# Patient Record
Sex: Male | Born: 2000 | Race: Black or African American | Hispanic: No | Marital: Single | Smoking: Never smoker
Health system: Southern US, Community
[De-identification: ages and names within clinical notes are randomized; demographics above are authoritative.]

## PROBLEM LIST (undated history)

## (undated) DIAGNOSIS — E119 Type 2 diabetes mellitus without complications: Secondary | ICD-10-CM

## (undated) DIAGNOSIS — J45909 Unspecified asthma, uncomplicated: Secondary | ICD-10-CM

## (undated) HISTORY — PX: TYMPANOSTOMY TUBE PLACEMENT: SHX32

## (undated) HISTORY — PX: ADENOIDECTOMY: SUR15

## (undated) HISTORY — PX: TONSILLECTOMY: SUR1361

---
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
## 2020-10-27 IMAGING — DX DG CHEST 1V PORT
1 series · 1 of 1 positions shown · non-contrast
Comparison: 07/15/2016

CLINICAL DATA: Fever, shortness of breath, diarrhea and headache
for the past 2 days.

EXAM:
PORTABLE CHEST 1 VIEW

[chest ap]
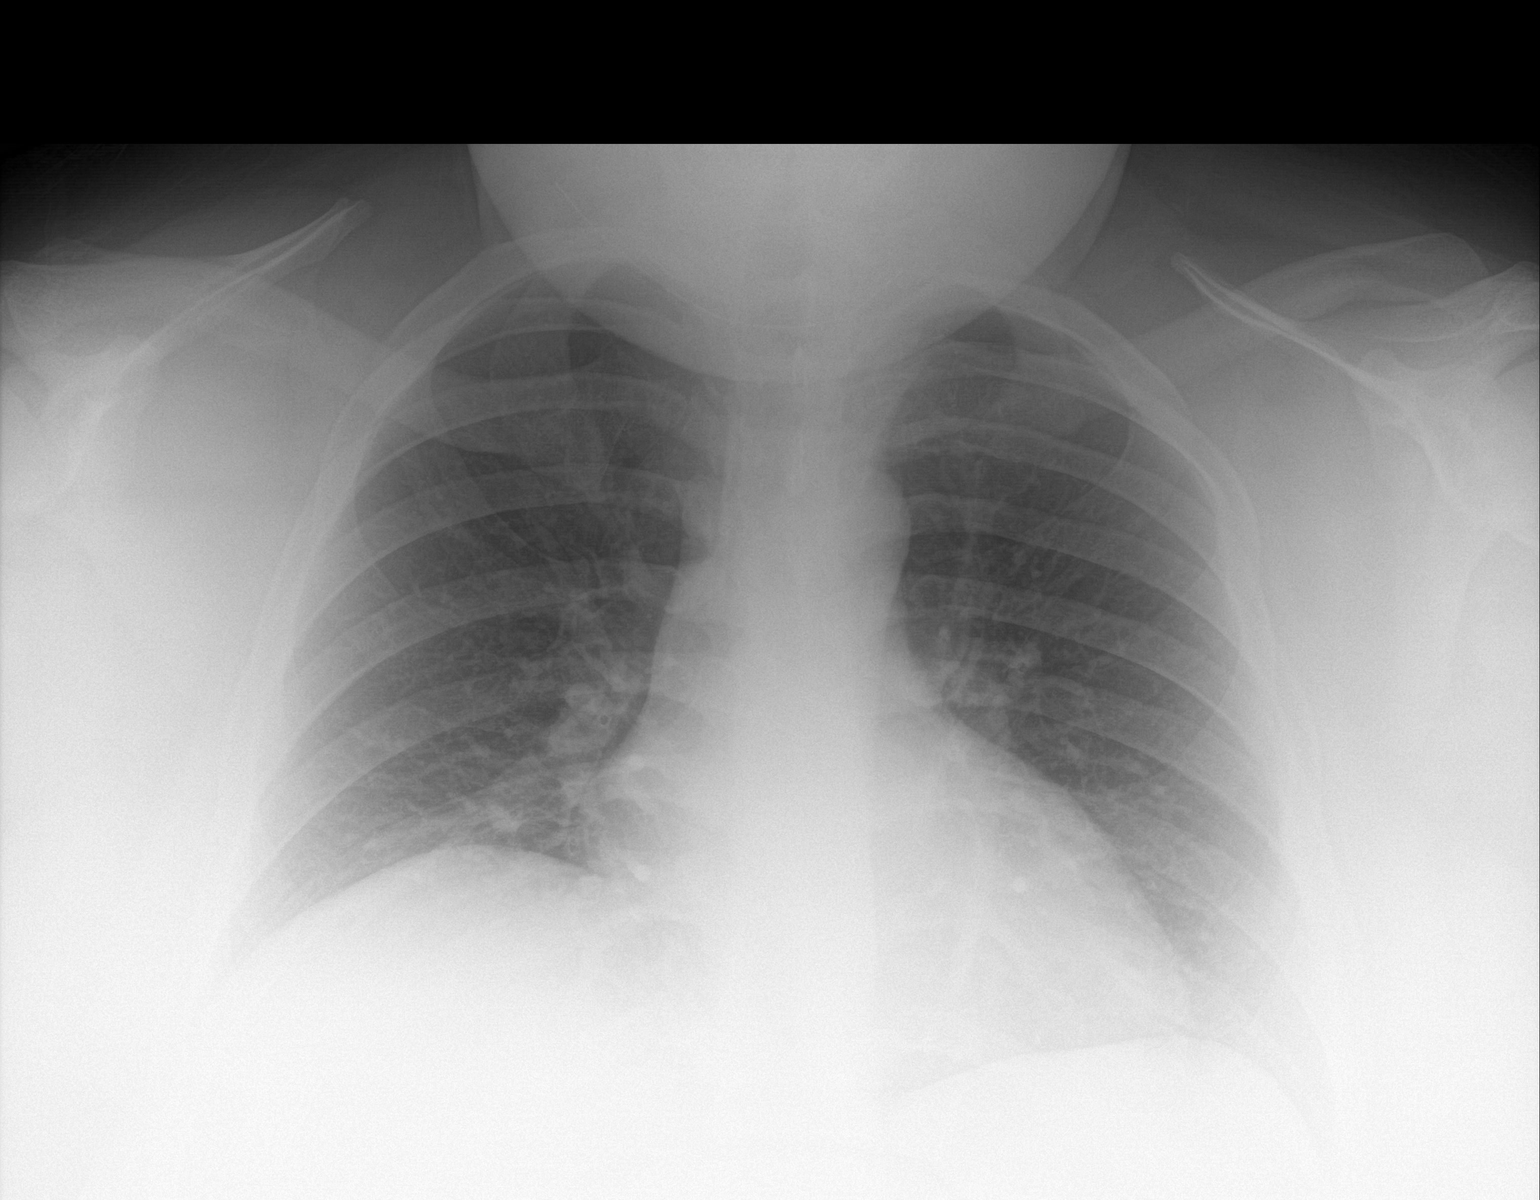

[1 of 1 positions shown; findings below may reference images not displayed]

FINDINGS: Examination is degraded due to patient body habitus and portable
technique.

Grossly unchanged borderline enlarged cardiac silhouette and
mediastinal contours given reduced lung volumes. No discrete focal
airspace opacities. No pleural effusion or pneumothorax. No evidence
of edema. No acute osseous abnormalities.
IMPRESSION: Followup PA and lateral chest X-ray is recommended in 3-4 weeks
following trial of antibiotic therapy to ensure resolution and
exclude underlying malignancy.

## 2022-06-25 ENCOUNTER — Emergency Department (HOSPITAL_BASED_OUTPATIENT_CLINIC_OR_DEPARTMENT_OTHER)
Admission: EM | Admit: 2022-06-25 | Discharge: 2022-06-25 | Disposition: A | Discharge status: Home / Self Care | Payer: Medicaid Other | Attending: Emergency Medicine | Admitting: Emergency Medicine

## 2022-06-25 ENCOUNTER — Encounter (HOSPITAL_BASED_OUTPATIENT_CLINIC_OR_DEPARTMENT_OTHER): Payer: Self-pay | Admitting: *Deleted

## 2022-06-25 ENCOUNTER — Other Ambulatory Visit: Payer: Self-pay

## 2022-06-25 DIAGNOSIS — Z7984 Long term (current) use of oral hypoglycemic drugs: Secondary | ICD-10-CM | POA: Insufficient documentation

## 2022-06-25 DIAGNOSIS — Z20822 Contact with and (suspected) exposure to covid-19: Secondary | ICD-10-CM | POA: Diagnosis not present

## 2022-06-25 DIAGNOSIS — R739 Hyperglycemia, unspecified: Secondary | ICD-10-CM

## 2022-06-25 DIAGNOSIS — R197 Diarrhea, unspecified: Secondary | ICD-10-CM | POA: Diagnosis not present

## 2022-06-25 DIAGNOSIS — E1165 Type 2 diabetes mellitus with hyperglycemia: Secondary | ICD-10-CM | POA: Insufficient documentation

## 2022-06-25 DIAGNOSIS — R112 Nausea with vomiting, unspecified: Secondary | ICD-10-CM | POA: Diagnosis present

## 2022-06-25 DIAGNOSIS — R42 Dizziness and giddiness: Secondary | ICD-10-CM | POA: Diagnosis not present

## 2022-06-25 DIAGNOSIS — E860 Dehydration: Secondary | ICD-10-CM | POA: Diagnosis not present

## 2022-06-25 DIAGNOSIS — E876 Hypokalemia: Secondary | ICD-10-CM | POA: Diagnosis not present

## 2022-06-25 HISTORY — DX: Type 2 diabetes mellitus without complications: E11.9

## 2022-06-25 LAB — COMPREHENSIVE METABOLIC PANEL
ALT: 38 U/L (ref 0–44)
AST: 28 U/L (ref 15–41)
Albumin: 3.6 g/dL (ref 3.5–5.0)
Alkaline Phosphatase: 59 U/L (ref 38–126)
Anion gap: 11 (ref 5–15)
BUN: 9 mg/dL (ref 6–20)
CO2: 21 mmol/L — ABNORMAL LOW (ref 22–32)
Calcium: 8.8 mg/dL — ABNORMAL LOW (ref 8.9–10.3)
Chloride: 99 mmol/L (ref 98–111)
Creatinine, Ser: 0.8 mg/dL (ref 0.61–1.24)
GFR, Estimated: >60 mL/min (ref >60)
Glucose, Bld: 225 mg/dL — ABNORMAL HIGH (ref 70–99)
Potassium: 2.9 mmol/L — ABNORMAL LOW (ref 3.5–5.1)
Sodium: 131 mmol/L — ABNORMAL LOW (ref 135–145)
Total Bilirubin: 0.9 mg/dL (ref 0.3–1.2)
Total Protein: 7.8 g/dL (ref 6.5–8.1)

## 2022-06-25 LAB — URINALYSIS, MICROSCOPIC (REFLEX)
RBC / HPF: NONE SEEN RBC/hpf (ref 0–5)
WBC, UA: NONE SEEN WBC/hpf (ref 0–5)

## 2022-06-25 LAB — CBC
HCT: 44.9 % (ref 39.0–52.0)
Hemoglobin: 15.1 g/dL (ref 13.0–17.0)
MCH: 27.9 pg (ref 26.0–34.0)
MCHC: 33.6 g/dL (ref 30.0–36.0)
MCV: 82.8 fL (ref 80.0–100.0)
Platelets: 353 10*3/uL (ref 150–400)
RBC: 5.42 MIL/uL (ref 4.22–5.81)
RDW: 11.4 % — ABNORMAL LOW (ref 11.5–15.5)
WBC: 7.5 10*3/uL (ref 4.0–10.5)
nRBC: 0 % (ref 0.0–0.2)

## 2022-06-25 LAB — LIPASE, BLOOD: Lipase: 20 U/L (ref 11–51)

## 2022-06-25 LAB — URINALYSIS, ROUTINE W REFLEX MICROSCOPIC
Glucose, UA: >=500 mg/dL — AB
Hgb urine dipstick: NEGATIVE
Ketones, ur: NEGATIVE mg/dL
Leukocytes,Ua: NEGATIVE
Nitrite: NEGATIVE
Protein, ur: 100 mg/dL — AB
Specific Gravity, Urine: 1.025 (ref 1.005–1.030)
pH: 5 (ref 5.0–8.0)

## 2022-06-25 LAB — RESP PANEL BY RT-PCR (RSV, FLU A&B, COVID)  RVPGX2
Influenza A by PCR: NEGATIVE
Influenza B by PCR: NEGATIVE
Resp Syncytial Virus by PCR: NEGATIVE
SARS Coronavirus 2 by RT PCR: NEGATIVE

## 2022-06-25 LAB — CBG MONITORING, ED: Glucose-Capillary: 232 mg/dL — ABNORMAL HIGH (ref 70–99)

## 2022-06-25 MED ORDER — ONDANSETRON 4 MG PO TBDP
4.0000 mg | ORAL_TABLET | Freq: Once | ORAL | Status: AC | PRN
  Administered 2022-06-25: 4 mg via ORAL
  Filled 2022-06-25: qty 1

## 2022-06-25 MED ORDER — FAMOTIDINE IN NACL 20-0.9 MG/50ML-% IV SOLN
20.0000 mg | Freq: Once | INTRAVENOUS | Status: AC
  Administered 2022-06-25: 20 mg via INTRAVENOUS
  Filled 2022-06-25: qty 50

## 2022-06-25 MED ORDER — SODIUM CHLORIDE 0.9 % IV BOLUS
1000.0000 mL | Freq: Once | INTRAVENOUS | Status: AC
  Administered 2022-06-25: 1000 mL via INTRAVENOUS

## 2022-06-25 MED ORDER — POTASSIUM CHLORIDE CRYS ER 20 MEQ PO TBCR
40.0000 meq | EXTENDED_RELEASE_TABLET | Freq: Once | ORAL | Status: AC
  Administered 2022-06-25: 40 meq via ORAL
  Filled 2022-06-25: qty 2

## 2022-06-25 MED ORDER — ONDANSETRON 4 MG PO TBDP: 4.0000 mg | ORAL_TABLET | Freq: Three times a day (TID) | ORAL | 0 refills | Status: AC | PRN

## 2022-06-25 NOTE — ED Notes (Signed)
Pt laying supine in bed, appears comfortable. Warm blankets provided. Call bell in reach. Pt verbalized no additional need at this time.

## 2022-06-25 NOTE — ED Provider Notes (Signed)
[facility] Provider Note   CSN: [id number] Arrival date & time: 06/25/22  1859     History {Add pertinent medical, surgical, social history, OB history to HPI:1} Chief Complaint  Patient presents with  . Dehydration    Jacob Mcfarland is a 22 y.o. male.  He has a history of diabetes type 2.  He has had nausea vomiting diarrhea and some crampy abdominal pain since yesterday.  Symptoms are improving today but he feels just generally weak and lightheaded.  No blood in the vomitus or diarrhea.  No fevers.  Family recommended he come to the emergency department as he is a diabetic.  He works driving a Human resources officer and so he thinks one of the kids might of gotten him sick.  The history is provided by the patient.  Emesis Severity:  Moderate Duration:  24 hours Timing:  Intermittent Quality:  Stomach contents Progression:  Improving Chronicity:  New Recent urination:  Normal Relieved by:  None tried Worsened by:  Liquids Ineffective treatments:  None tried Associated symptoms: abdominal pain and diarrhea   Associated symptoms: no fever        Home Medications Prior to Admission medications   Medication Sig Start Date End Date Taking? Authorizing Provider  albuterol (PROVENTIL HFA;VENTOLIN HFA) 108 (90 BASE) MCG/ACT inhaler Inhale 2 puffs into the lungs every 6 (six) hours as needed.    [provider]  beclomethasone (QVAR) 80 MCG/ACT inhaler Inhale 1 puff into the lungs as needed.    [provider]  cetirizine (ZYRTEC) 10 MG tablet Take 10 mg by mouth daily.    [provider]  dicyclomine (BENTYL) 20 MG tablet Take 1 tablet (20 mg total) by mouth every 8 (eight) hours as needed for spasms. 05/21/19   Petrucelli, Samantha R, PA-C  EPINEPHrine (EPIPEN JR) 0.15 MG/0.3ML injection Inject into the muscle.    [provider]  fluticasone (FLONASE) 50 MCG/ACT nasal spray Place 1 spray into the nose daily.     [provider]  metFORMIN (GLUCOPHAGE-XR) 500 MG 24 hr tablet Take 1 tablet (500 mg total) by mouth daily with breakfast. 05/21/19   Petrucelli, Samantha R, PA-C  ondansetron (ZOFRAN ODT) 4 MG disintegrating tablet Take 1 tablet (4 mg total) by mouth every 8 (eight) hours as needed for nausea or vomiting. 05/21/19   Petrucelli, Glynda Jaeger, PA-C      Allergies    Bioflavonoids    Review of Systems   Review of Systems  Constitutional:  Negative for fever.  Gastrointestinal:  Positive for abdominal pain, diarrhea and vomiting.    Physical Exam Updated Vital Signs BP 117/72   Pulse 85   Temp 98.9 F (37.2 C) (Oral)   Resp 20   Ht 5\' 8"  (1.727 m)   Wt 104.3 kg   SpO2 99%   BMI 34.97 kg/m  Physical Exam  ED Results / Procedures / Treatments   Labs (all labs ordered are listed, but only abnormal results are displayed) Labs Reviewed  CBC - Abnormal; Notable for the following components:      Result Value   RDW 11.4 (*)    All other components within normal limits  COMPREHENSIVE METABOLIC PANEL - Abnormal; Notable for the following components:   Sodium 131 (*)    Potassium 2.9 (*)    CO2 21 (*)    Glucose, Bld 225 (*)    Calcium 8.8 (*)    All other components within normal  limits  CBG MONITORING, ED - Abnormal; Notable for the following components:   Glucose-Capillary 232 (*)    All other components within normal limits  RESP PANEL BY RT-PCR (RSV, FLU A&B, COVID)  RVPGX2  LIPASE, BLOOD  URINALYSIS, ROUTINE W REFLEX MICROSCOPIC  CBG MONITORING, ED    EKG None  Radiology No results found.  Procedures Procedures  {Document cardiac monitor, telemetry assessment procedure when appropriate:1}  Medications Ordered in ED Medications  sodium chloride 0.9 % bolus 1,000 mL (has no administration in time range)  famotidine (PEPCID) IVPB 20 mg premix (has no administration in time range)  ondansetron (ZOFRAN-ODT) disintegrating tablet 4 mg (4 mg Oral Given 06/25/22  1943)    ED Course/ Medical Decision Making/ A&P Clinical Course as of 06/25/22 2058  Wed Jun 25, 2022  2041 Labs showing low sodium low potassium low bicarb although normal gap and ketones negative.  Getting IV fluids and Pepcid, when feeling better will orally replete with some potassium.  No evidence of DKA.  Normal white count normal hemoglobin. [MB]    Clinical Course User Index [MB] Hayden Rasmussen, MD   {   Click here for ABCD2, HEART and other calculatorsREFRESH Note before signing :1}                          Medical Decision Making Amount and/or Complexity of Data Reviewed Labs: ordered.  Risk Prescription drug management.   ***  {Document critical care time when appropriate:1} {Document review of labs and clinical decision tools ie heart score, Chads2Vasc2 etc:1}  {Document your independent review of radiology images, and any outside records:1} {Document your discussion with family members, caretakers, and with consultants:1} {Document social determinants of health affecting pt's care:1} {Document your decision making why or why not admission, treatments were needed:1} Final Clinical Impression(s) / ED Diagnoses Final diagnoses:  None    Rx / DC Orders ED Discharge Orders     None

## 2022-06-25 NOTE — Discharge Instructions (Signed)
You are seen in the emergency department for nausea and vomiting with elevated blood sugars.  Your lab work showed some dehydration and low potassium.  You are given fluids and potassium with improvement in your symptoms.  We are prescribing you some nausea medication to use at home as needed.  Please follow-up with your regular doctor and return to the emergency department if any worsening or concerning symptoms

## 2022-06-25 NOTE — ED Triage Notes (Signed)
Patient states last night he had onset of abd pain and n/v.  Patient states he had 10 episodes of n/v last night and this morning.  Patient with complaints of headache and feeling light headed.  Patient reports he is a diabetic.  His sugars have been in the 300's.  He is insulin dependent.  Last dose was at 1400.  Patient has been able to drink some fluids but he has had poor appetite.  Patient denies fever.  Patient reports he has had diarrhea as well

## 2022-06-25 NOTE — ED Notes (Signed)
Pt A&OX4 ambulatory at d/c with independent steady gait. Pt verbalized understanding of d/c instructions, prescription and follow up care. 

## 2023-01-16 ENCOUNTER — Other Ambulatory Visit: Payer: Self-pay

## 2023-01-16 ENCOUNTER — Emergency Department (HOSPITAL_BASED_OUTPATIENT_CLINIC_OR_DEPARTMENT_OTHER)
Admission: EM | Admit: 2023-01-16 | Discharge: 2023-01-16 | Disposition: A | Discharge status: Home / Self Care | Payer: Medicaid Other | Attending: Emergency Medicine | Admitting: Emergency Medicine

## 2023-01-16 ENCOUNTER — Encounter (HOSPITAL_BASED_OUTPATIENT_CLINIC_OR_DEPARTMENT_OTHER): Payer: Self-pay

## 2023-01-16 DIAGNOSIS — J029 Acute pharyngitis, unspecified: Secondary | ICD-10-CM | POA: Diagnosis present

## 2023-01-16 DIAGNOSIS — R5383 Other fatigue: Secondary | ICD-10-CM | POA: Insufficient documentation

## 2023-01-16 DIAGNOSIS — E119 Type 2 diabetes mellitus without complications: Secondary | ICD-10-CM | POA: Insufficient documentation

## 2023-01-16 DIAGNOSIS — Z7984 Long term (current) use of oral hypoglycemic drugs: Secondary | ICD-10-CM | POA: Insufficient documentation

## 2023-01-16 DIAGNOSIS — Z20822 Contact with and (suspected) exposure to covid-19: Secondary | ICD-10-CM | POA: Diagnosis not present

## 2023-01-16 LAB — SARS CORONAVIRUS 2 BY RT PCR: SARS Coronavirus 2 by RT PCR: NEGATIVE

## 2023-01-16 LAB — GROUP A STREP BY PCR: Group A Strep by PCR: NOT DETECTED

## 2023-01-16 NOTE — ED Triage Notes (Signed)
The patient is having sore throat, fatigue and shortness of breath since yesterday.

## 2023-01-16 NOTE — ED Provider Notes (Signed)
[place] [facility]MENT AT [facility] Provider Note   CSN: [id number] Arrival date & time: 01/16/23  1547     History  Chief Complaint  Patient presents with   Sore Throat   Shortness of Breath    Jacob Mcfarland is a 22 y.o. male.  Patient with history of diabetes presents to the emergency department for evaluation of sore throat and fatigue for 1 day.  No associated fevers, ear pain.  He does have some congestion.  No cough.  States that he went to work and felt bad and could not continue.  No vomiting or diarrhea.  States that he has not checked his blood sugar in about a week but denies increased thirst or urination.       Home Medications Prior to Admission medications   Medication Sig Start Date End Date Taking? Authorizing Provider  albuterol (PROVENTIL HFA;VENTOLIN HFA) 108 (90 BASE) MCG/ACT inhaler Inhale 2 puffs into the lungs every 6 (six) hours as needed.    [provider]  beclomethasone (QVAR) 80 MCG/ACT inhaler Inhale 1 puff into the lungs as needed.    [provider]  cetirizine (ZYRTEC) 10 MG tablet Take 10 mg by mouth daily.    [provider]  dicyclomine (BENTYL) 20 MG tablet Take 1 tablet (20 mg total) by mouth every 8 (eight) hours as needed for spasms. 05/21/19   Petrucelli, Samantha R, PA-C  EPINEPHrine (EPIPEN JR) 0.15 MG/0.3ML injection Inject into the muscle.    [provider]  fluticasone (FLONASE) 50 MCG/ACT nasal spray Place 1 spray into the nose daily.    [provider]  metFORMIN (GLUCOPHAGE-XR) 500 MG 24 hr tablet Take 1 tablet (500 mg total) by mouth daily with breakfast. 05/21/19   Petrucelli, Samantha R, PA-C  ondansetron (ZOFRAN ODT) 4 MG disintegrating tablet Take 1 tablet (4 mg total) by mouth every 8 (eight) hours as needed for nausea or vomiting. 06/25/22   Terrilee Files, MD      Allergies    Bioflavonoids    Review of Systems   Review of Systems  Physical  Exam Updated Vital Signs BP (!) 142/94 (BP Location: Left Arm)   Pulse 84   Temp 98 F (36.7 C) (Oral)   Resp 20   Ht 5\' 8"  (1.727 m)   Wt 104 kg   SpO2 97%   BMI 34.86 kg/m  Physical Exam Vitals and nursing note reviewed.  Constitutional:      Appearance: He is well-developed.  HENT:     Head: Normocephalic and atraumatic.     Jaw: No trismus.     Right Ear: Tympanic membrane, ear canal and external ear normal.     Left Ear: Tympanic membrane, ear canal and external ear normal.     Nose: Congestion present. No mucosal edema or rhinorrhea.     Comments: Edema noted, left greater than right.    Mouth/Throat:     Mouth: Mucous membranes are not dry.     Pharynx: Uvula midline. Posterior oropharyngeal erythema present. No oropharyngeal exudate or uvula swelling.     Tonsils: No tonsillar abscesses.  Eyes:     General:        Right eye: No discharge.        Left eye: No discharge.     Conjunctiva/sclera: Conjunctivae normal.  Neck:     Comments: Full range of motion of the neck without difficulty Cardiovascular:     Rate and Rhythm: Normal  rate and regular rhythm.     Heart sounds: Normal heart sounds.  Pulmonary:     Effort: Pulmonary effort is normal. No respiratory distress.     Breath sounds: Normal breath sounds. No wheezing or rales.     Comments: Lungs are clear bilaterally Abdominal:     Palpations: Abdomen is soft.     Tenderness: There is no abdominal tenderness.  Musculoskeletal:     Cervical back: Normal range of motion and neck supple.  Skin:    General: Skin is warm and dry.  Neurological:     Mental Status: He is alert.     ED Results / Procedures / Treatments   Labs (all labs ordered are listed, but only abnormal results are displayed) Labs Reviewed  GROUP A STREP BY PCR  SARS CORONAVIRUS 2 BY RT PCR    EKG None  Radiology No results found.  Procedures Procedures    Medications Ordered in ED Medications - No data to display  ED  Course/ Medical Decision Making/ A&P    Patient seen and examined. History obtained directly from patient. Work-up including labs, imaging, EKG ordered in triage, if performed, were reviewed.    Labs/EKG: Independently reviewed and interpreted.  This included: Negative strep and COVID  Imaging: None ordered  Medications/Fluids: None ordered  Most recent vital signs reviewed and are as follows: BP (!) 142/94 (BP Location: Left Arm)   Pulse 84   Temp 98 F (36.7 C) (Oral)   Resp 20   Ht 5\' 8"  (1.727 m)   Wt 104 kg   SpO2 97%   BMI 34.86 kg/m   Initial impression: Pharyngitis, no red flags  Home treatment plan: OTC meds  Return instructions discussed with patient: Trouble swallowing, new or worsening symptoms  Follow-up instructions discussed with patient: PCP follow-up in 5 days if not improving                                Medical Decision Making  In regards to the patient's sore throat today, the following dangerous and potentially life threatening etiologies were considered on the differential diagnosis: Lugwig's angina, uvulitis, epiglottis, peritonsillar abscess, retropharyngeal abscess, Lemierre's syndrome. Also considered were more common causes such as: streptococcal pharyngitis, gonococcal pharyngitis, non-bacterial pharyngitis (cold viruses, HSV/coxsackievirus, influenza, COVID-19, infectious mononucleosis, oropharyngeal candidiasis), and other non-infectious causes including seasonal allergies/post-nasal drip, GERD/esophagitis, trauma.   The patient's vital signs, pertinent lab work and imaging were reviewed and interpreted as discussed in the ED course. Hospitalization was considered for further testing, treatments, or serial exams/observation. However as patient is well-appearing, has a stable exam, and reassuring studies today, I do not feel that they warrant admission at this time. This plan was discussed with the patient who verbalizes agreement and comfort with  this plan and seems reliable and able to return to the Emergency Department with worsening or changing symptoms.          Final Clinical Impression(s) / ED Diagnoses Final diagnoses:  Sore throat    Rx / DC Orders ED Discharge Orders     None         Renne Crigler, PA-C 01/16/23 1756    Vanetta Mulders, MD 01/16/23 [phone]

## 2023-01-16 NOTE — Discharge Instructions (Signed)
Please read and follow all provided instructions.  Your diagnoses today include:  1. Sore throat     Tests performed today include: Strep test: was negative for strep COVID test was negative for COVID Vital signs. See below for your results today.   Medications prescribed:  Please use over-the-counter NSAID medications (ibuprofen, naproxen) or Tylenol (acetaminophen) as directed on the packaging for pain -- as long as you do not have any reasons avoid these medications. Reasons to avoid NSAID medications include: weak kidneys, a history of bleeding in your stomach or gut, or uncontrolled high blood pressure or previous heart attack. Reasons to avoid Tylenol include: liver problems or ongoing alcohol use. Never take more than 4000mg  or 8 Extra strength Tylenol in a 24 hour period.     Take any medications prescribed only as directed.   Home care instructions:  Please read the educational materials provided and follow any instructions contained in this packet.  Follow-up instructions: Please follow-up with your primary care provider as needed for further evaluation of your symptoms.  Return instructions:  Please return to the Emergency Department if you experience worsening symptoms.  Return if you have worsening problems swallowing, your neck becomes swollen, you cannot swallow your saliva or your voice becomes muffled.  Return with high persistent fever, persistent vomiting, or if you have trouble breathing.  Please return if you have any other emergent concerns.  Additional Information:  Your vital signs today were: BP (!) 142/94 (BP Location: Left Arm)   Pulse 84   Temp 98 F (36.7 C) (Oral)   Resp 20   Ht 5\' 8"  (1.727 m)   Wt 104 kg   SpO2 97%   BMI 34.86 kg/m  If your blood pressure (BP) was elevated above 135/85 this visit, please have this repeated by your doctor within one month. --------------

## 2023-04-23 ENCOUNTER — Emergency Department (HOSPITAL_BASED_OUTPATIENT_CLINIC_OR_DEPARTMENT_OTHER): Payer: Medicaid Other

## 2023-04-23 ENCOUNTER — Other Ambulatory Visit: Payer: Self-pay

## 2023-04-23 ENCOUNTER — Other Ambulatory Visit (HOSPITAL_BASED_OUTPATIENT_CLINIC_OR_DEPARTMENT_OTHER): Payer: Self-pay

## 2023-04-23 ENCOUNTER — Emergency Department (HOSPITAL_BASED_OUTPATIENT_CLINIC_OR_DEPARTMENT_OTHER)
Admission: EM | Admit: 2023-04-23 | Discharge: 2023-04-23 | Disposition: A | Discharge status: Home / Self Care | Payer: Medicaid Other | Attending: Emergency Medicine | Admitting: Emergency Medicine

## 2023-04-23 DIAGNOSIS — R5383 Other fatigue: Secondary | ICD-10-CM | POA: Diagnosis present

## 2023-04-23 DIAGNOSIS — E1165 Type 2 diabetes mellitus with hyperglycemia: Secondary | ICD-10-CM | POA: Diagnosis not present

## 2023-04-23 DIAGNOSIS — E860 Dehydration: Secondary | ICD-10-CM | POA: Diagnosis not present

## 2023-04-23 DIAGNOSIS — Z20822 Contact with and (suspected) exposure to covid-19: Secondary | ICD-10-CM | POA: Diagnosis not present

## 2023-04-23 DIAGNOSIS — J45909 Unspecified asthma, uncomplicated: Secondary | ICD-10-CM | POA: Insufficient documentation

## 2023-04-23 DIAGNOSIS — Z7951 Long term (current) use of inhaled steroids: Secondary | ICD-10-CM | POA: Diagnosis not present

## 2023-04-23 DIAGNOSIS — Z7984 Long term (current) use of oral hypoglycemic drugs: Secondary | ICD-10-CM | POA: Insufficient documentation

## 2023-04-23 DIAGNOSIS — I10 Essential (primary) hypertension: Secondary | ICD-10-CM | POA: Insufficient documentation

## 2023-04-23 LAB — CBG MONITORING, ED
Glucose-Capillary: 402 mg/dL — ABNORMAL HIGH (ref 70–99)
Glucose-Capillary: 378 mg/dL — ABNORMAL HIGH (ref 70–99)
Glucose-Capillary: 327 mg/dL — ABNORMAL HIGH (ref 70–99)

## 2023-04-23 LAB — URINALYSIS, MICROSCOPIC (REFLEX): RBC / HPF: NONE SEEN RBC/hpf (ref 0–5)

## 2023-04-23 LAB — URINALYSIS, ROUTINE W REFLEX MICROSCOPIC
Bilirubin Urine: NEGATIVE
Glucose, UA: >=500 mg/dL — AB
Hgb urine dipstick: NEGATIVE
Ketones, ur: 80 mg/dL — AB
Leukocytes,Ua: NEGATIVE
Nitrite: NEGATIVE
Protein, ur: NEGATIVE mg/dL
Specific Gravity, Urine: 1.02 (ref 1.005–1.030)
pH: 5.5 (ref 5.0–8.0)

## 2023-04-23 LAB — BASIC METABOLIC PANEL
Anion gap: 15 (ref 5–15)
BUN: 12 mg/dL (ref 6–20)
CO2: 19 mmol/L — ABNORMAL LOW (ref 22–32)
Calcium: 9.1 mg/dL (ref 8.9–10.3)
Chloride: 96 mmol/L — ABNORMAL LOW (ref 98–111)
Creatinine, Ser: 0.79 mg/dL (ref 0.61–1.24)
GFR, Estimated: >60 mL/min (ref >60)
Glucose, Bld: 435 mg/dL — ABNORMAL HIGH (ref 70–99)
Potassium: 3.8 mmol/L (ref 3.5–5.1)
Sodium: 130 mmol/L — ABNORMAL LOW (ref 135–145)

## 2023-04-23 LAB — I-STAT VENOUS BLOOD GAS, ED
Acid-base deficit: 3 mmol/L — ABNORMAL HIGH (ref 0.0–2.0)
Bicarbonate: 21.7 mmol/L (ref 20.0–28.0)
Calcium, Ion: 1.16 mmol/L (ref 1.15–1.40)
HCT: 43 % (ref 39.0–52.0)
Hemoglobin: 14.6 g/dL (ref 13.0–17.0)
O2 Saturation: 66 %
Potassium: 4.2 mmol/L (ref 3.5–5.1)
Sodium: 133 mmol/L — ABNORMAL LOW (ref 135–145)
TCO2: 23 mmol/L (ref 22–32)
pCO2, Ven: 37.3 mm[Hg] — ABNORMAL LOW (ref 44–60)
pH, Ven: 7.372 (ref 7.25–7.43)
pO2, Ven: 35 mm[Hg] (ref 32–45)

## 2023-04-23 LAB — CBC WITH DIFFERENTIAL/PLATELET
Abs Immature Granulocytes: 0.04 10*3/uL (ref 0.00–0.07)
Basophils Absolute: 0 10*3/uL (ref 0.0–0.1)
Basophils Relative: 0 %
Eosinophils Absolute: 0.1 10*3/uL (ref 0.0–0.5)
Eosinophils Relative: 0 %
HCT: 44.3 % (ref 39.0–52.0)
Hemoglobin: 14.7 g/dL (ref 13.0–17.0)
Immature Granulocytes: 0 %
Lymphocytes Relative: 14 %
Lymphs Abs: 1.8 10*3/uL (ref 0.7–4.0)
MCH: 27.5 pg (ref 26.0–34.0)
MCHC: 33.2 g/dL (ref 30.0–36.0)
MCV: 82.8 fL (ref 80.0–100.0)
Monocytes Absolute: 1 10*3/uL (ref 0.1–1.0)
Monocytes Relative: 8 %
Neutro Abs: 9.4 10*3/uL — ABNORMAL HIGH (ref 1.7–7.7)
Neutrophils Relative %: 78 %
Platelets: 318 10*3/uL (ref 150–400)
RBC: 5.35 MIL/uL (ref 4.22–5.81)
RDW: 11.8 % (ref 11.5–15.5)
WBC: 12.3 10*3/uL — ABNORMAL HIGH (ref 4.0–10.5)
nRBC: 0 % (ref 0.0–0.2)

## 2023-04-23 LAB — RESP PANEL BY RT-PCR (RSV, FLU A&B, COVID)  RVPGX2
Influenza A by PCR: NEGATIVE
Influenza B by PCR: NEGATIVE
Resp Syncytial Virus by PCR: NEGATIVE
SARS Coronavirus 2 by RT PCR: NEGATIVE

## 2023-04-23 MED ORDER — SODIUM CHLORIDE 0.9 % IV BOLUS
1000.0000 mL | Freq: Once | INTRAVENOUS | Status: AC
  Administered 2023-04-23: 1000 mL via INTRAVENOUS

## 2023-04-23 MED ORDER — ULTICARE SHORT PEN NEEDLES 31G X 8 MM MISC
1.0000 | Freq: Four times a day (QID) | 0 refills | Status: AC
  Filled 2023-04-23: qty 100, 25d supply, fill #0

## 2023-04-23 MED ORDER — INSULIN ASPART 100 UNIT/ML IJ SOLN
10.0000 [IU] | Freq: Once | INTRAMUSCULAR | Status: AC
  Administered 2023-04-23: 10 [IU] via SUBCUTANEOUS

## 2023-04-23 MED ORDER — POTASSIUM CHLORIDE CRYS ER 20 MEQ PO TBCR
40.0000 meq | EXTENDED_RELEASE_TABLET | Freq: Once | ORAL | Status: AC
  Administered 2023-04-23: 40 meq via ORAL
  Filled 2023-04-23: qty 2

## 2023-04-23 MED ORDER — POTASSIUM CHLORIDE 10 MEQ/100ML IV SOLN
10.0000 meq | Freq: Once | INTRAVENOUS | Status: AC
  Administered 2023-04-23: 10 meq via INTRAVENOUS
  Filled 2023-04-23: qty 100

## 2023-04-23 NOTE — ED Notes (Signed)
X-ray at bedside

## 2023-04-23 NOTE — ED Triage Notes (Signed)
Pt here with "dehydration". Pt has been fatigues, feels like he is breathing differently, runny nose, congestions. This has been going on for 2 days. Denies fever. Denies sore throat or pain

## 2023-04-23 NOTE — Discharge Instructions (Signed)
You were seen in the emergency department for high blood sugar and dehydration The reason your blood sugar was so high was because you have not been taking her insulin as prescribed We gave you IV fluids and potassium and insulin here and your blood sugar started to come down Please go directly to the med center at [facility] pharmacy to pick up your new pen needles which we have called in You should be covered by insurance Follow-up with your primary care doctor in 1 week for reevaluation Return to the emergency department for uncontrolled blood sugars or any other concerns

## 2023-04-23 NOTE — ED Notes (Signed)
MD notified of 402 BG.

## 2023-04-23 NOTE — ED Provider Notes (Signed)
[place] [facility] AT [facility] Provider Note  CSN: [id number] Arrival date & time: 04/23/23 4098  Chief Complaint(s) Fatigue  HPI Jacob Mcfarland is a 22 y.o. male with past medical history as below, significant for IDDM, obesity, hypertension, asthma who presents to the ED with complaint of fatigue, dehydration, congestion  Symptoms ongoing the past 2 days, no fevers or chills, nausea or vomiting.  He does report increased thirst and dry mouth over the past few days.  Denies polyuria.  Reports he has run out of his insulin needles and has not received insulin 5 to 7 days, has not checked his blood sugar either.  Per chart review he is prescribed Ozempic 2mg , Humalog 15 units 3 times daily, Toujeo 160 units daily.  He does have history of prior admission for diabetic ketoacidosis few years ago.  Past Medical History Past Medical History:  Diagnosis Date   Asthma    Diabetes mellitus without complication (HCC)    Morbid obesity (HCC)    Patient Active Problem List   Diagnosis Date Noted   Asthma 12/24/2017   Snoring 04/20/2017   Gynecomastia, male 11/24/2016   Hypertension 07/16/2016   BMI (body mass index), pediatric, greater than 99% for age 53/01/2016   Obesity 10/07/2011   Seasonal allergies 10/07/2011   Home Medication(s) Prior to Admission medications   Medication Sig Start Date End Date Taking? Authorizing Provider  albuterol (PROVENTIL HFA;VENTOLIN HFA) 108 (90 BASE) MCG/ACT inhaler Inhale 2 puffs into the lungs every 6 (six) hours as needed.    [provider]  beclomethasone (QVAR) 80 MCG/ACT inhaler Inhale 1 puff into the lungs as needed.    [provider]  cetirizine (ZYRTEC) 10 MG tablet Take 10 mg by mouth daily.    [provider]  dicyclomine (BENTYL) 20 MG tablet Take 1 tablet (20 mg total) by mouth every 8 (eight) hours as needed for spasms. 05/21/19   Petrucelli, Samantha R, PA-C  EPINEPHrine (EPIPEN JR)  0.15 MG/0.3ML injection Inject into the muscle.    [provider]  fluticasone (FLONASE) 50 MCG/ACT nasal spray Place 1 spray into the nose daily.    [provider]  Insulin Pen Needle (ULTICARE SHORT PEN NEEDLES) 31G X 8 MM MISC Use as directed 4 (four) times daily. 04/23/23   Royanne Foots, DO  metFORMIN (GLUCOPHAGE-XR) 500 MG 24 hr tablet Take 1 tablet (500 mg total) by mouth daily with breakfast. 05/21/19   Petrucelli, Samantha R, PA-C  ondansetron (ZOFRAN ODT) 4 MG disintegrating tablet Take 1 tablet (4 mg total) by mouth every 8 (eight) hours as needed for nausea or vomiting. 06/25/22   Terrilee Files, MD                                                                                                                                    Past Surgical History Past Surgical History:  Procedure  Laterality Date   ADENOIDECTOMY     TONSILLECTOMY     TYMPANOSTOMY TUBE PLACEMENT     Family History Family History  Problem Relation Age of Onset   Hypertension Mother    Hypertension Father    Diabetes Father     Social History Social History   Tobacco Use   Smoking status: Never   Smokeless tobacco: Never  Substance Use Topics   Alcohol use: No   Drug use: No   Allergies Bioflavonoids  Review of Systems Review of Systems  Constitutional:  Positive for fatigue. Negative for chills and fever.  HENT:  Positive for congestion and postnasal drip.   Respiratory:  Negative for chest tightness and shortness of breath.   Cardiovascular:  Negative for chest pain.  Gastrointestinal:  Negative for abdominal pain, diarrhea and vomiting.  Endocrine: Positive for polydipsia.  Genitourinary:  Negative for dysuria and hematuria.  Musculoskeletal:  Negative for arthralgias.  All other systems reviewed and are negative.   Physical Exam Vital Signs  I have reviewed the triage vital signs BP (!) 147/88   Pulse 91   Temp 98.7 F (37.1 C)   Resp (!) 31   Ht 5\' 10"  (1.778  m)   Wt (!) 165.6 kg   SpO2 98%   BMI 52.37 kg/m  Physical Exam Vitals and nursing note reviewed.  Constitutional:      General: He is not in acute distress.    Appearance: He is well-developed. He is obese.  HENT:     Head: Normocephalic and atraumatic.     Right Ear: External ear normal.     Left Ear: External ear normal.     Mouth/Throat:     Mouth: Mucous membranes are dry.  Eyes:     General: No scleral icterus. Cardiovascular:     Rate and Rhythm: Regular rhythm. Tachycardia present.     Pulses: Normal pulses.     Heart sounds: Normal heart sounds.  Pulmonary:     Effort: Pulmonary effort is normal. No respiratory distress.     Breath sounds: Normal breath sounds.  Abdominal:     General: Abdomen is flat.     Palpations: Abdomen is soft.     Tenderness: There is no abdominal tenderness.  Musculoskeletal:     Cervical back: No rigidity.     Right lower leg: No edema.     Left lower leg: No edema.  Skin:    General: Skin is warm and dry.     Capillary Refill: Capillary refill takes less than 2 seconds.  Neurological:     Mental Status: He is alert.  Psychiatric:        Mood and Affect: Mood normal.        Behavior: Behavior normal.     ED Results and Treatments Labs (all labs ordered are listed, but only abnormal results are displayed) Labs Reviewed  CBC WITH DIFFERENTIAL/PLATELET - Abnormal; Notable for the following components:      Result Value   WBC 12.3 (*)    Neutro Abs 9.4 (*)    All other components within normal limits  BASIC METABOLIC PANEL - Abnormal; Notable for the following components:   Sodium 130 (*)    Chloride 96 (*)    CO2 19 (*)    Glucose, Bld 435 (*)    All other components within normal limits  URINALYSIS, ROUTINE W REFLEX MICROSCOPIC - Abnormal; Notable for the following components:   Glucose, UA >=500 (*)  Ketones, ur 80 (*)    All other components within normal limits  URINALYSIS, MICROSCOPIC (REFLEX) - Abnormal; Notable  for the following components:   Bacteria, UA RARE (*)    All other components within normal limits  CBG MONITORING, ED - Abnormal; Notable for the following components:   Glucose-Capillary 402 (*)    All other components within normal limits  I-STAT VENOUS BLOOD GAS, ED - Abnormal; Notable for the following components:   pCO2, Ven 37.3 (*)    Acid-base deficit 3.0 (*)    Sodium 133 (*)    All other components within normal limits  CBG MONITORING, ED - Abnormal; Notable for the following components:   Glucose-Capillary 378 (*)    All other components within normal limits  CBG MONITORING, ED - Abnormal; Notable for the following components:   Glucose-Capillary 327 (*)    All other components within normal limits  RESP PANEL BY RT-PCR (RSV, FLU A&B, COVID)  RVPGX2                                                                                                                          Radiology No results found.  Pertinent labs & imaging results that were available during my care of the patient were reviewed by me and considered in my medical decision making (see MDM for details).  Medications Ordered in ED Medications  sodium chloride 0.9 % bolus 1,000 mL (0 mLs Intravenous Stopped 04/23/23 0741)  sodium chloride 0.9 % bolus 1,000 mL (0 mLs Intravenous Stopped 04/23/23 0856)  potassium chloride SA (KLOR-CON M) CR tablet 40 mEq (40 mEq Oral Given 04/23/23 0700)  insulin aspart (novoLOG) injection 10 Units (10 Units Subcutaneous Given 04/23/23 0733)  potassium chloride 10 mEq in 100 mL IVPB (0 mEq Intravenous Stopped 04/23/23 0810)                                                                                                                                     Procedures Procedures  (including critical care time)  Medical Decision Making / ED Course    Medical Decision Making:    Jacob Mcfarland is a 22 y.o. male with past medical history as below, significant for IDDM, obesity,  hypertension, asthma who presents to the ED with complaint of fatigue, dehydration, congestion. The complaint involves an extensive differential diagnosis and also carries with it a high  risk of complications and morbidity.  Serious etiology was considered. Ddx includes but is not limited to: DKA, metabolic derangement, electrolyte derangement, hypovolemia, viral syndrome, pneumonia, etc.  Complete initial physical exam performed, notably the patient was in no acute distress, sitting upright on stretcher, tachycardia noted.  No hypoxia.    Reviewed and confirmed nursing documentation for past medical history, family history, social history.  Vital signs reviewed.     Clinical Course as of 04/24/23 0707  Thu Apr 23, 2023  2130 Per chart review he is rx'd Ozempic 2mg , Humalog 15 units 3 times daily, Toujeo 160 units daily  [SG]  0652 Ketones, ur(!): 80 [SG]  0652 CO2(!): 19 [SG]  0652 Anion gap: 15 [SG]  0652 Glucose-Capillary(!): 402 Give IV fluids, replete potassium.  Check pH.  He has not had his insulin in around 1 week.  [SG]  [id number] Sodium(!): 130 Pseudohyponatremia in setting of hyperglycemia [SG]       Clinical Course User Index  [SG] Sloan Leiter, DO    Brief summary: 23 yo male here with elev glucose, uri s/s. Labs stable as above. Given insulin and K replacement. DKA unlikely at this time, feeling better. Handoff to incoming EDP pending recheck and rpt CBG. He is having trouble paying for insulin supplies, would be reasonable to reach out to CM to see about low cost supplies, he saw his pcp around 2 wks ago. Labs stable, handoff to incoming EDP                  Additional history obtained: -Additional history obtained from  -External records from outside source obtained and reviewed including: Chart review including previous notes, labs, imaging, consultation notes including  Pcp documentation Prior ed visits Prior meds    Lab Tests: -I ordered, reviewed,  and interpreted labs.   The pertinent results include:   Labs Reviewed  CBC WITH DIFFERENTIAL/PLATELET - Abnormal; Notable for the following components:      Result Value   WBC 12.3 (*)    Neutro Abs 9.4 (*)    All other components within normal limits  BASIC METABOLIC PANEL - Abnormal; Notable for the following components:   Sodium 130 (*)    Chloride 96 (*)    CO2 19 (*)    Glucose, Bld 435 (*)    All other components within normal limits  URINALYSIS, ROUTINE W REFLEX MICROSCOPIC - Abnormal; Notable for the following components:   Glucose, UA >=500 (*)    Ketones, ur 80 (*)    All other components within normal limits  URINALYSIS, MICROSCOPIC (REFLEX) - Abnormal; Notable for the following components:   Bacteria, UA RARE (*)    All other components within normal limits  CBG MONITORING, ED - Abnormal; Notable for the following components:   Glucose-Capillary 402 (*)    All other components within normal limits  I-STAT VENOUS BLOOD GAS, ED - Abnormal; Notable for the following components:   pCO2, Ven 37.3 (*)    Acid-base deficit 3.0 (*)    Sodium 133 (*)    All other components within normal limits  CBG MONITORING, ED - Abnormal; Notable for the following components:   Glucose-Capillary 378 (*)    All other components within normal limits  CBG MONITORING, ED - Abnormal; Notable for the following components:   Glucose-Capillary 327 (*)    All other components within normal limits  RESP PANEL BY RT-PCR (RSV, FLU A&B, COVID)  [id number]    Notable for  as above, glucose elev  EKG   EKG Interpretation Date/Time:    Ventricular Rate:    PR Interval:    QRS Duration:    QT Interval:    QTC Calculation:   R Axis:      Text Interpretation:           Imaging Studies ordered: I ordered imaging studies including CXR I independently visualized the following imaging with scope of interpretation limited to determining acute life threatening conditions related to emergency care;  findings noted above I independently visualized and interpreted imaging. I agree with the radiologist interpretation   Medicines ordered and prescription drug management: Meds ordered this encounter  Medications   sodium chloride 0.9 % bolus 1,000 mL   sodium chloride 0.9 % bolus 1,000 mL   potassium chloride SA (KLOR-CON M) CR tablet 40 mEq   insulin aspart (novoLOG) injection 10 Units   potassium chloride 10 mEq in 100 mL IVPB    -I have reviewed the patients home medicines and have made adjustments as needed   Consultations Obtained: na   Cardiac Monitoring: Continuous pulse oximetry interpreted by myself, 99% on RA.    Social Determinants of Health:  Diagnosis or treatment significantly limited by social determinants of health: obesity   Reevaluation: After the interventions noted above, I reevaluated the patient and found that they have improved  Co morbidities that complicate the patient evaluation  Past Medical History:  Diagnosis Date   Asthma    Diabetes mellitus without complication (HCC)    Morbid obesity (HCC)       Dispostion: Disposition decision including need for hospitalization was considered, and patient disposition pending at time of sign out.    Final Clinical Impression(s) / ED Diagnoses Final diagnoses:  Uncontrolled type 2 diabetes mellitus with hyperglycemia (HCC)  Dehydration        Sloan Leiter, DO 04/24/23 2956

## 2023-04-23 NOTE — ED Provider Notes (Signed)
  Physical Exam  BP (!) 147/88   Pulse 91   Temp 98.7 F (37.1 C)   Resp (!) 31   Ht 5\' 10"  (1.778 m)   Wt (!) 165.6 kg   SpO2 98%   BMI 52.37 kg/m   Physical Exam Vitals and nursing note reviewed.  HENT:     Head: Normocephalic and atraumatic.  Eyes:     Pupils: Pupils are equal, round, and reactive to light.  Cardiovascular:     Rate and Rhythm: Normal rate and regular rhythm.  Pulmonary:     Effort: Pulmonary effort is normal.     Breath sounds: Normal breath sounds.  Abdominal:     Palpations: Abdomen is soft.     Tenderness: There is no abdominal tenderness.  Skin:    General: Skin is warm and dry.  Neurological:     Mental Status: He is alert.  Psychiatric:        Mood and Affect: Mood normal.     Procedures  Procedures  ED Course / MDM   Clinical Course as of 04/23/23 0943  Thu Apr 23, 2023  1610 Per chart review he is rx'd Ozempic 2mg , Humalog 15 units 3 times daily, Toujeo 160 units daily  [SG]  0652 Ketones, ur(!): 80 [SG]  0652 CO2(!): 19 [SG]  0652 Anion gap: 15 [SG]  0652 Glucose-Capillary(!): 402 Give IV fluids, replete potassium.  Check pH.  He has not had his insulin in around 1 week.  [SG]  [id number] Sodium(!): 130 Pseudohyponatremia in setting of hyperglycemia [SG]  0931 CBG still elevated but downtrending.  Patient reports feeling much better after IV fluids, potassium and insulin here.  He has all of his medications at home but the issue has been obtaining diabetic needles as last time he went to [facility] the needles were about $100 out-of-pocket for 100 needles.  He typically pays around $20 for 100 needles.  I have called our pharmacy here at [facility] and ordered a prescription for new pen needles which the patient will pick up and should be covered by his insurance.  We have them in stock.  He has all of his other medications at home.  Stable for discharge with close PCP follow-up [MP]    Clinical Course User Index [MP] Royanne Foots,  DO [SG] Sloan Leiter, DO   Medical Decision Making I, Estelle June DO, have assumed care of this patient from the previous provider pending reevaluation and repeat blood glucose check  Amount and/or Complexity of Data Reviewed Labs: ordered. Decision-making details documented in ED Course. Radiology: ordered.  Risk Prescription drug management.   Final diagnoses Uncontrolled diabetes mellitus with hyperglycemia Dehydration       Royanne Foots, DO 04/23/23 9604

## 2023-04-23 NOTE — ED Notes (Addendum)
Glucose was 378

## 2023-05-18 ENCOUNTER — Other Ambulatory Visit: Payer: Self-pay

## 2024-01-22 ENCOUNTER — Other Ambulatory Visit: Payer: Self-pay

## 2024-01-22 ENCOUNTER — Encounter (HOSPITAL_BASED_OUTPATIENT_CLINIC_OR_DEPARTMENT_OTHER): Payer: Self-pay

## 2024-01-22 ENCOUNTER — Emergency Department (HOSPITAL_BASED_OUTPATIENT_CLINIC_OR_DEPARTMENT_OTHER)
Admission: EM | Admit: 2024-01-22 | Discharge: 2024-01-22 | Disposition: A | Discharge status: Home / Self Care | Attending: Emergency Medicine | Admitting: Emergency Medicine

## 2024-01-22 DIAGNOSIS — Z794 Long term (current) use of insulin: Secondary | ICD-10-CM | POA: Diagnosis not present

## 2024-01-22 DIAGNOSIS — R42 Dizziness and giddiness: Secondary | ICD-10-CM | POA: Insufficient documentation

## 2024-01-22 DIAGNOSIS — Z7951 Long term (current) use of inhaled steroids: Secondary | ICD-10-CM | POA: Diagnosis not present

## 2024-01-22 DIAGNOSIS — R110 Nausea: Secondary | ICD-10-CM | POA: Insufficient documentation

## 2024-01-22 DIAGNOSIS — J45909 Unspecified asthma, uncomplicated: Secondary | ICD-10-CM | POA: Insufficient documentation

## 2024-01-22 DIAGNOSIS — E119 Type 2 diabetes mellitus without complications: Secondary | ICD-10-CM | POA: Diagnosis not present

## 2024-01-22 DIAGNOSIS — R519 Headache, unspecified: Secondary | ICD-10-CM | POA: Insufficient documentation

## 2024-01-22 DIAGNOSIS — Z7984 Long term (current) use of oral hypoglycemic drugs: Secondary | ICD-10-CM | POA: Diagnosis not present

## 2024-01-22 LAB — CBC WITH DIFFERENTIAL/PLATELET
Abs Immature Granulocytes: 0.02 K/uL (ref 0.00–0.07)
Basophils Absolute: 0 K/uL (ref 0.0–0.1)
Basophils Relative: 0 %
Eosinophils Absolute: 0.1 K/uL (ref 0.0–0.5)
Eosinophils Relative: 2 %
HCT: 44.9 % (ref 39.0–52.0)
Hemoglobin: 15.5 g/dL (ref 13.0–17.0)
Immature Granulocytes: 0 %
Lymphocytes Relative: 40 %
Lymphs Abs: 3.2 K/uL (ref 0.7–4.0)
MCH: 28.3 pg (ref 26.0–34.0)
MCHC: 34.5 g/dL (ref 30.0–36.0)
MCV: 82.1 fL (ref 80.0–100.0)
Monocytes Absolute: 0.5 K/uL (ref 0.1–1.0)
Monocytes Relative: 7 %
Neutro Abs: 4.1 K/uL (ref 1.7–7.7)
Neutrophils Relative %: 51 %
Platelets: 368 K/uL (ref 150–400)
RBC: 5.47 MIL/uL (ref 4.22–5.81)
RDW: 11.6 % (ref 11.5–15.5)
WBC: 7.9 K/uL (ref 4.0–10.5)
nRBC: 0 % (ref 0.0–0.2)

## 2024-01-22 LAB — COMPREHENSIVE METABOLIC PANEL WITH GFR
ALT: 62 U/L — ABNORMAL HIGH (ref 0–44)
AST: 45 U/L — ABNORMAL HIGH (ref 15–41)
Albumin: 4.4 g/dL (ref 3.5–5.0)
Alkaline Phosphatase: 77 U/L (ref 38–126)
Anion gap: 15 (ref 5–15)
BUN: 12 mg/dL (ref 6–20)
CO2: 22 mmol/L (ref 22–32)
Calcium: 9.7 mg/dL (ref 8.9–10.3)
Chloride: 100 mmol/L (ref 98–111)
Creatinine, Ser: 0.81 mg/dL (ref 0.61–1.24)
GFR, Estimated: >60 mL/min (ref >60)
Glucose, Bld: 287 mg/dL — ABNORMAL HIGH (ref 70–99)
Potassium: 3.5 mmol/L (ref 3.5–5.1)
Sodium: 137 mmol/L (ref 135–145)
Total Bilirubin: 0.5 mg/dL (ref 0.0–1.2)
Total Protein: 7.4 g/dL (ref 6.5–8.1)

## 2024-01-22 LAB — LIPASE, BLOOD: Lipase: 16 U/L (ref 11–51)

## 2024-01-22 MED ORDER — METOCLOPRAMIDE HCL 5 MG/ML IJ SOLN
10.0000 mg | Freq: Once | INTRAMUSCULAR | Status: AC
  Administered 2024-01-22: 10 mg via INTRAVENOUS
  Filled 2024-01-22: qty 2

## 2024-01-22 MED ORDER — SODIUM CHLORIDE 0.9 % IV BOLUS
1000.0000 mL | Freq: Once | INTRAVENOUS | Status: AC
  Administered 2024-01-22: 1000 mL via INTRAVENOUS

## 2024-01-22 MED ORDER — KETOROLAC TROMETHAMINE 30 MG/ML IJ SOLN
15.0000 mg | Freq: Once | INTRAMUSCULAR | Status: AC
  Administered 2024-01-22: 15 mg via INTRAVENOUS
  Filled 2024-01-22: qty 1

## 2024-01-22 MED ORDER — METOCLOPRAMIDE HCL 10 MG PO TABS: 10.0000 mg | ORAL_TABLET | Freq: Three times a day (TID) | ORAL | 0 refills | Status: AC | PRN

## 2024-01-22 NOTE — ED Provider Notes (Signed)
 Jacob Mcfarland Provider Note   CSN: [id number] Arrival date & time: 01/22/24  2015     Patient presents with: Dizziness   Jacob Mcfarland is a 23 y.o. male with a past medical history morbid obesity poorly controlled type 2 diabetes who presents emergency department with lightheadedness nausea and headache.  Patient reports that he recently had his Ozempic dose increased last week.  Since that time he has been having intermittent nausea without vomiting and an aching headache.  Patient reports that he took his Ozempic again at 1:00 in the morning today and has had worsening in those persistent symptoms for the last week including worsening nausea lightheadedness and a headache.  He denies light sensitivity or vomiting, fever or chills, cough or urinary symptoms.    Dizziness      Prior to Admission medications   Medication Sig Start Date End Date Taking? Authorizing Provider  albuterol  (PROVENTIL  HFA;VENTOLIN  HFA) 108 (90 BASE) MCG/ACT inhaler Inhale 2 puffs into the lungs every 6 (six) hours as needed.    [provider]  beclomethasone (QVAR) 80 MCG/ACT inhaler Inhale 1 puff into the lungs as needed.    [provider]  cetirizine (ZYRTEC) 10 MG tablet Take 10 mg by mouth daily.    [provider]  dicyclomine  (BENTYL ) 20 MG tablet Take 1 tablet (20 mg total) by mouth every 8 (eight) hours as needed for spasms. 05/21/19   Petrucelli, Samantha R, PA-C  EPINEPHrine (EPIPEN JR) 0.15 MG/0.3ML injection Inject into the muscle.    [provider]  fluticasone (FLONASE) 50 MCG/ACT nasal spray Place 1 spray into the nose daily.    [provider]  Insulin  Pen Needle (ULTICARE SHORT PEN NEEDLES) 31G X 8 MM MISC Use as directed 4 (four) times daily. 04/23/23   [name] LABOR, DO  metFORMIN  (GLUCOPHAGE -XR) 500 MG 24 hr tablet Take 1 tablet (500 mg total) by mouth daily with breakfast. 05/21/19   Petrucelli,  Samantha R, PA-C  ondansetron  (ZOFRAN  ODT) 4 MG disintegrating tablet Take 1 tablet (4 mg total) by mouth every 8 (eight) hours as needed for nausea or vomiting. 06/25/22   Towana Ozell BROCKS, MD    Allergies: Bioflavonoids    Review of Systems  Neurological:  Positive for dizziness.    Updated Vital Signs BP (!) 160/111   Pulse 94   Temp 98.7 F (37.1 C) (Oral)   Resp 18   Ht 5' 9 (1.753 m)   Wt (!) 167.8 kg   SpO2 94%   BMI 54.64 kg/m   Physical Exam Physical Exam  Constitutional: Pt is oriented to person, place, and time. Pt appears well-developed and well-nourished. No distress.  HENT:  Head: Normocephalic and atraumatic.  Mouth/Throat: Oropharynx is clear and moist.  Eyes: Conjunctivae and EOM are normal. Pupils are equal, round, and reactive to light. No scleral icterus.  No horizontal, vertical or rotational nystagmus  Neck: Normal range of motion. Neck supple.  Full active and passive ROM without pain No midline or paraspinal tenderness No nuchal rigidity or meningeal signs  Cardiovascular: Normal rate, regular rhythm and intact distal pulses.   Pulmonary/Chest: Effort normal and breath sounds normal. No respiratory distress. Pt has no wheezes. No rales.  Abdominal: Soft. Bowel sounds are normal. There is no tenderness. There is no rebound and no guarding.  Musculoskeletal: Normal range of motion.  Lymphadenopathy:    No cervical adenopathy.  Neurological: Pt. is alert and oriented to  person, place, and time. He has normal reflexes. No cranial nerve deficit.  Exhibits normal muscle tone. Coordination normal.  Mental Status:  Alert, oriented, thought content appropriate. Speech fluent without evidence of aphasia. Able to follow 2 step commands without difficulty.  Cranial Nerves:  II:  Peripheral visual fields grossly normal, pupils equal, round, reactive to light III,IV, VI: ptosis not present, extra-ocular motions intact bilaterally  V,VII: smile symmetric, facial  light touch sensation equal VIII: hearing grossly normal bilaterally  IX,X: midline uvula rise  XI: bilateral shoulder shrug equal and strong XII: midline tongue extension  Motor:  5/5 in upper and lower extremities bilaterally including strong and equal grip strength and dorsiflexion/plantar flexion Sensory: Pinprick and light touch normal in all extremities.  Deep Tendon Reflexes: 2+ and symmetric  Cerebellar: normal finger-to-nose with bilateral upper extremities Gait: normal gait and balance CV: distal pulses palpable throughout   Skin: Skin is warm and dry. No rash noted. Pt is not diaphoretic.  Psychiatric: Pt has a normal mood and affect. Behavior is normal. Judgment and thought content normal.  Nursing note and vitals reviewed.  (all labs ordered are listed, but only abnormal results are displayed) Labs Reviewed  COMPREHENSIVE METABOLIC PANEL WITH GFR - Abnormal; Notable for the following components:      Result Value   Glucose, Bld 287 (*)    AST 45 (*)    ALT 62 (*)    All other components within normal limits  CBC WITH DIFFERENTIAL/PLATELET  LIPASE, BLOOD  URINALYSIS, ROUTINE W REFLEX MICROSCOPIC    EKG: EKG Interpretation Date/Time:  Friday January 22 2024 20:32:48 EDT Ventricular Rate:  88 PR Interval:  167 QRS Duration:  90 QT Interval:  360 QTC Calculation: 436 R Axis:   47  Text Interpretation: Sinus rhythm Ventricular premature complex Low voltage, precordial leads Confirmed by Darra Chew [phone]) on 01/22/2024 8:43:22 PM  Radiology: No results found.   Procedures   Medications Ordered in the ED  sodium chloride  0.9 % bolus 1,000 mL (has no administration in time range)  metoCLOPramide  (REGLAN ) injection 10 mg (has no administration in time range)  ketorolac  (TORADOL ) 30 MG/ML injection 15 mg (has no administration in time range)                                    Medical Decision Making Amount and/or Complexity of Data Reviewed Labs:  ordered.  Risk Prescription drug management.   This patient presents to the ED for concern of headache, nausea, this involves an extensive number of treatment options, and is a complaint that carries with it a high risk of complications and morbidity.  Patient with dull headache and nausea most likely due to poor oral intake and increased dose of his Ozempic.  Differential also includes pancreatitis, gastritis, Gi virus, biliary colic, migraine headache.  Co morbidities:  Past Medical History:  Diagnosis Date   Asthma    Diabetes mellitus without complication (HCC)    Morbid obesity (HCC)      Social Determinants of Health:   SDOH Screenings   Food Insecurity: Low Risk  (10/01/2023)   Received from [facility]  Housing: Low Risk  (10/01/2023)   Received from [facility]  Transportation Needs: No Transportation Needs (10/01/2023)   Received from [facility]  Utilities: Low Risk  (10/01/2023)   Received from [facility]  Physical Activity: Insufficiently Active (08/26/2021)   Received from  [facility]  Social Connections: Unknown (09/24/2021)   Received from [facility]  Tobacco Use: Low Risk  (01/22/2024)     Additional history:  {Additional history obtained from family  {External records from outside source obtained and reviewed including outside endocrine notes  Lab Tests:  I Ordered, and personally interpreted labs.  The pertinent results include:   Glu 287 AST ALT 45/62 ALP normal (suspect fatty liver) seen on previous labs Lipase wnl   Cardiac Monitoring/ECG:  The patient was maintained on a cardiac monitor.  I personally viewed and interpreted the cardiac monitored which showed an underlying rhythm of:  NSR rate of 88     Medicines ordered and prescription drug management:  I ordered medication including  Medications  sodium chloride  0.9 % bolus 1,000 mL (has no administration in time range)  metoCLOPramide  (REGLAN ) injection 10 mg (has no  administration in time range)  ketorolac  (TORADOL ) 30 MG/ML injection 15 mg (has no administration in time range)   for headache and nausea Reevaluation of the patient after these medicines showed that the patient resolved I have reviewed the patients home medicines and have made adjustments as needed  Test Considered:  I considered abd imaging (US / CT) but abdominal exam is benign  Critical Interventions:     Consultations Obtained:   Problem List / ED Course:  No diagnosis found.  MDM: Patient w/ nausea and headache after increased dose of ozempic. Sxs resolved. No signs of pancreatitis biliary colic. Afebrile and hds   Dispostion:  After consideration of the diagnostic results and the patients response to treatment, I feel that the patent would benefit from discharge with reglan - follow closely with endicrinology.      Final diagnoses:  None    ED Discharge Orders     None          Arloa Chroman, PA-C 01/23/24 1052    Long, Fonda MATSU, MD 01/25/24 1517

## 2024-01-22 NOTE — Discharge Instructions (Addendum)
 General instructions Take over-the-counter and prescription medicines only as told by your health care provider. Rest at home while you recover. Drink enough fluid to keep your urine pale yellow. Breathe slowly and deeply when you feel nauseous. Avoid smelling things that have strong odors. Wash your hands often using soap and water for at least 20 seconds. If soap and water are not available, use hand sanitizer. Make sure that everyone in your household washes their hands well and often. Keep all follow-up visits. This is important. Contact a health care provider if: Your nausea gets worse. Your nausea does not go away after two days. You vomit multiple times. You cannot drink fluids without vomiting. You have any of the following: New symptoms. A fever. A headache. Muscle cramps. A rash. Pain while urinating. You feel light-headed or dizzy. Get help right away if: You have pain in your chest, neck, arm, or jaw. You feel extremely weak or you faint. You have vomit that is bright red or looks like coffee grounds. You have bloody or black stools (feces) or stools that look like tar. You have a severe headache, a stiff neck, or both. You have severe pain, cramping, or bloating in your abdomen. You have difficulty breathing or are breathing very quickly. Your heart is beating very quickly. Your skin feels cold and clammy. You feel confused. You have signs of dehydration, such as: Dark urine, very little urine, or no urine. Cracked lips. Dry mouth. Sunken eyes. Sleepiness. Weakness. These symptoms may be an emergency. Get help right away. Call 911. Do not wait to see if the symptoms will go away. Do not drive yourself to the hospital.

## 2024-01-22 NOTE — ED Triage Notes (Signed)
 Has been using auto-pen Ozempic at 1. Pt told his provider he was not feeling the effects and they increased him to 2.   Since then has been feeling increased dizziness.

## 2024-03-24 ENCOUNTER — Encounter (HOSPITAL_COMMUNITY): Payer: Self-pay

## 2024-03-24 ENCOUNTER — Emergency Department (HOSPITAL_COMMUNITY)
Admission: EM | Admit: 2024-03-24 | Discharge: 2024-03-24 | Disposition: A | Discharge status: Home / Self Care | Attending: Emergency Medicine | Admitting: Emergency Medicine

## 2024-03-24 ENCOUNTER — Other Ambulatory Visit: Payer: Self-pay

## 2024-03-24 DIAGNOSIS — E860 Dehydration: Secondary | ICD-10-CM | POA: Insufficient documentation

## 2024-03-24 DIAGNOSIS — E1165 Type 2 diabetes mellitus with hyperglycemia: Secondary | ICD-10-CM | POA: Insufficient documentation

## 2024-03-24 DIAGNOSIS — R739 Hyperglycemia, unspecified: Secondary | ICD-10-CM

## 2024-03-24 DIAGNOSIS — Z794 Long term (current) use of insulin: Secondary | ICD-10-CM | POA: Insufficient documentation

## 2024-03-24 LAB — COMPREHENSIVE METABOLIC PANEL WITH GFR
ALT: 50 U/L — ABNORMAL HIGH (ref 0–44)
AST: 26 U/L (ref 15–41)
Albumin: 4 g/dL (ref 3.5–5.0)
Alkaline Phosphatase: 106 U/L (ref 38–126)
Anion gap: 13 (ref 5–15)
BUN: 15 mg/dL (ref 6–20)
CO2: 23 mmol/L (ref 22–32)
Calcium: 9.2 mg/dL (ref 8.9–10.3)
Chloride: 99 mmol/L (ref 98–111)
Creatinine, Ser: 0.96 mg/dL (ref 0.61–1.24)
GFR, Estimated: >60 mL/min (ref >60)
Glucose, Bld: 441 mg/dL — ABNORMAL HIGH (ref 70–99)
Potassium: 4.1 mmol/L (ref 3.5–5.1)
Sodium: 135 mmol/L (ref 135–145)
Total Bilirubin: 0.4 mg/dL (ref 0.0–1.2)
Total Protein: 7.2 g/dL (ref 6.5–8.1)

## 2024-03-24 LAB — CBC WITH DIFFERENTIAL/PLATELET
Abs Immature Granulocytes: 0.03 K/uL (ref 0.00–0.07)
Basophils Absolute: 0 K/uL (ref 0.0–0.1)
Basophils Relative: 0 %
Eosinophils Absolute: 0.2 K/uL (ref 0.0–0.5)
Eosinophils Relative: 2 %
HCT: 44.7 % (ref 39.0–52.0)
Hemoglobin: 14.5 g/dL (ref 13.0–17.0)
Immature Granulocytes: 0 %
Lymphocytes Relative: 41 %
Lymphs Abs: 3.3 K/uL (ref 0.7–4.0)
MCH: 27.6 pg (ref 26.0–34.0)
MCHC: 32.4 g/dL (ref 30.0–36.0)
MCV: 85.1 fL (ref 80.0–100.0)
Monocytes Absolute: 0.6 K/uL (ref 0.1–1.0)
Monocytes Relative: 8 %
Neutro Abs: 3.8 K/uL (ref 1.7–7.7)
Neutrophils Relative %: 49 %
Platelets: 352 K/uL (ref 150–400)
RBC: 5.25 MIL/uL (ref 4.22–5.81)
RDW: 11.4 % — ABNORMAL LOW (ref 11.5–15.5)
WBC: 8 K/uL (ref 4.0–10.5)
nRBC: 0 % (ref 0.0–0.2)

## 2024-03-24 LAB — BLOOD GAS, VENOUS
Acid-Base Excess: 0 mmol/L (ref 0.0–2.0)
Bicarbonate: 25.4 mmol/L (ref 20.0–28.0)
O2 Saturation: 73.1 %
Patient temperature: 37
pCO2, Ven: 43 mmHg — ABNORMAL LOW (ref 44–60)
pH, Ven: 7.38 (ref 7.25–7.43)
pO2, Ven: 40 mmHg (ref 32–45)

## 2024-03-24 LAB — BETA-HYDROXYBUTYRIC ACID: Beta-Hydroxybutyric Acid: 0.21 mmol/L (ref 0.05–0.27)

## 2024-03-24 LAB — CBG MONITORING, ED
Glucose-Capillary: 436 mg/dL — ABNORMAL HIGH (ref 70–99)
Glucose-Capillary: 480 mg/dL — ABNORMAL HIGH (ref 70–99)

## 2024-03-24 MED ORDER — LACTATED RINGERS IV BOLUS
1000.0000 mL | Freq: Once | INTRAVENOUS | Status: AC
  Administered 2024-03-24: 1000 mL via INTRAVENOUS

## 2024-03-24 MED ORDER — INSULIN GLARGINE-YFGN 100 UNIT/ML ~~LOC~~ SOLN
50.0000 [IU] | Freq: Once | SUBCUTANEOUS | Status: AC
  Administered 2024-03-24: 50 [IU] via SUBCUTANEOUS
  Filled 2024-03-24: qty 0.5

## 2024-03-24 NOTE — ED Triage Notes (Addendum)
 BIBA from work, sudden onset of [facility], by the time EMS srrived he wasn't [facility]. takes 100 units long acting insulin  per day and 20 units of short acting insulin  for each meal. Took his own CBG today at about 1630 got 238.  EMS had a CBG of high.  Hx type 2 DM  Aox4 20g LAC 800 ml NS  HR 92 112/72

## 2024-03-24 NOTE — ED Provider Notes (Signed)
 [facility] EMERGENCY DEPARTMENT AT [facility] Provider Note   CSN: [id number] Arrival date & time: 03/24/24  9972     History Chief Complaint  Patient presents with   Hyperglycemia    HPI Jacob Mcfarland is a 23 y.o. male presenting for chief complaint of elevated blood glucose.  States that he had eaten less than normal over the past 48 hours so he skipped his 2 fast-acting insulin  dose.  He is on 100 units of Lantus twice daily as well as 20 units 3 times daily of fast acting insulin .  He has been skipping the fast acting due to poor p.o. intake today as he feels that he was eating less than normal.  Notably he states stopping at [facility] prior to coming here.   Patient's recorded medical, surgical, social, medication list and allergies were reviewed in the Snapshot window as part of the initial history.   Review of Systems   Review of Systems  Constitutional:  Positive for fatigue. Negative for chills and fever.  HENT:  Negative for ear pain and sore throat.   Eyes:  Negative for pain and visual disturbance.  Respiratory:  Negative for cough and shortness of breath.   Cardiovascular:  Negative for chest pain and palpitations.  Gastrointestinal:  Negative for abdominal pain and vomiting.  Genitourinary:  Negative for dysuria and hematuria.  Musculoskeletal:  Negative for arthralgias and back pain.  Skin:  Negative for color change and rash.  Neurological:  Negative for seizures and syncope.  All other systems reviewed and are negative.   Physical Exam Updated Vital Signs BP (!) 149/94   Pulse 88   Temp 98.1 F (36.7 C) (Oral)   Resp 18   Ht 5' 10 (1.778 m)   Wt (!) 160.6 kg   SpO2 97%   BMI 50.79 kg/m  Physical Exam Vitals and nursing note reviewed.  Constitutional:      General: He is not in acute distress.    Appearance: He is well-developed.  HENT:     Head: Normocephalic and atraumatic.  Eyes:     Conjunctiva/sclera: Conjunctivae normal.   Cardiovascular:     Rate and Rhythm: Normal rate and regular rhythm.     Heart sounds: No murmur heard. Pulmonary:     Effort: Pulmonary effort is normal. No respiratory distress.     Breath sounds: Normal breath sounds.  Abdominal:     Palpations: Abdomen is soft.     Tenderness: There is no abdominal tenderness.  Musculoskeletal:        General: No swelling.     Cervical back: Neck supple.  Skin:    General: Skin is warm and dry.     Capillary Refill: Capillary refill takes less than 2 seconds.  Neurological:     Mental Status: He is alert.  Psychiatric:        Mood and Affect: Mood normal.      ED Course/ Medical Decision Making/ A&P    Procedures Procedures   Medications Ordered in ED Medications  lactated ringers bolus 1,000 mL (0 mLs Intravenous Stopped 03/24/24 0253)  insulin  glargine-yfgn (SEMGLEE) injection 50 Units (50 Units Subcutaneous Given 03/24/24 0251)    Medical Decision Making:   Patient's history of present on his physical exam findings are most consistent with hyperglycemia.  Lab work performed to evaluate for underlying ketoacidosis or other acute hyperglycemic crisis.  Lab work fortunately does not show evidence of DKA, hyperosmolality or any other acute pathology.  Blood glucose chronically elevated, A1c elevated at baseline.  Blood glucose is downtrending with IV fluids and treatment with his home slow release insulin .  Patient educated on importance of maintaining treatment as prescribed and recommended return to the emergency department any changes in symptoms.  Evaluated in the emergency room over 4 hours with serial blood glucoses all of which are appropriate for his underlying condition.  Patient stated he would follow-up with his endocrinologist for ongoing care management and return if he acutely worsens.  Clinical Impression:  1. Hyperglycemia   2. Dehydration      Data Unavailable   Final Clinical Impression(s) / ED Diagnoses Final  diagnoses:  Hyperglycemia  Dehydration    Rx / DC Orders ED Discharge Orders     None         Jerral Meth, MD 03/24/24 0430
